# Patient Record
Sex: Female | Born: 1980 | Race: Black or African American | Hispanic: No | Marital: Single | Smoking: Never smoker
Health system: Southern US, Community
[De-identification: ages and names within clinical notes are randomized; demographics above are authoritative.]

## PROBLEM LIST (undated history)

## (undated) DIAGNOSIS — R87629 Unspecified abnormal cytological findings in specimens from vagina: Secondary | ICD-10-CM

## (undated) DIAGNOSIS — F419 Anxiety disorder, unspecified: Secondary | ICD-10-CM

## (undated) HISTORY — DX: Unspecified abnormal cytological findings in specimens from vagina: R87.629

## (undated) HISTORY — PX: COLPOSCOPY: SHX161

---

## 2002-06-07 ENCOUNTER — Inpatient Hospital Stay (HOSPITAL_COMMUNITY): Admission: AD | Admit: 2002-06-07 | Discharge: 2002-06-09 | Payer: Self-pay | Admitting: *Deleted

## 2004-10-14 DIAGNOSIS — R87629 Unspecified abnormal cytological findings in specimens from vagina: Secondary | ICD-10-CM

## 2004-10-14 HISTORY — DX: Unspecified abnormal cytological findings in specimens from vagina: R87.629

## 2008-01-10 ENCOUNTER — Emergency Department (HOSPITAL_COMMUNITY): Admission: EM | Admit: 2008-01-10 | Discharge: 2008-01-10 | Payer: Self-pay | Admitting: Family Medicine

## 2010-12-24 ENCOUNTER — Other Ambulatory Visit: Payer: Self-pay | Admitting: Family Medicine

## 2010-12-24 DIAGNOSIS — N6311 Unspecified lump in the right breast, upper outer quadrant: Secondary | ICD-10-CM

## 2010-12-26 ENCOUNTER — Ambulatory Visit
Admission: RE | Admit: 2010-12-26 | Discharge: 2010-12-26 | Disposition: A | Discharge status: Home / Self Care | Payer: Self-pay | Source: Ambulatory Visit | Attending: Family Medicine | Admitting: Family Medicine

## 2010-12-26 DIAGNOSIS — N6311 Unspecified lump in the right breast, upper outer quadrant: Secondary | ICD-10-CM

## 2011-08-19 ENCOUNTER — Other Ambulatory Visit: Payer: Self-pay | Admitting: Family Medicine

## 2011-08-19 DIAGNOSIS — N6459 Other signs and symptoms in breast: Secondary | ICD-10-CM

## 2011-08-19 DIAGNOSIS — N63 Unspecified lump in unspecified breast: Secondary | ICD-10-CM

## 2011-09-19 ENCOUNTER — Ambulatory Visit
Admission: RE | Admit: 2011-09-19 | Discharge: 2011-09-19 | Disposition: A | Discharge status: Home / Self Care | Payer: Self-pay | Source: Ambulatory Visit | Attending: Family Medicine | Admitting: Family Medicine

## 2011-09-19 ENCOUNTER — Other Ambulatory Visit: Payer: Self-pay | Admitting: Family Medicine

## 2011-09-19 DIAGNOSIS — N63 Unspecified lump in unspecified breast: Secondary | ICD-10-CM

## 2011-09-19 DIAGNOSIS — N6459 Other signs and symptoms in breast: Secondary | ICD-10-CM

## 2011-09-20 ENCOUNTER — Other Ambulatory Visit: Payer: Self-pay | Admitting: Family Medicine

## 2011-09-20 ENCOUNTER — Ambulatory Visit
Admission: RE | Admit: 2011-09-20 | Discharge: 2011-09-20 | Disposition: A | Discharge status: Home / Self Care | Payer: No Typology Code available for payment source | Source: Ambulatory Visit | Attending: Family Medicine | Admitting: Family Medicine

## 2011-09-20 ENCOUNTER — Ambulatory Visit (INDEPENDENT_AMBULATORY_CARE_PROVIDER_SITE_OTHER): Payer: Self-pay | Admitting: *Deleted

## 2011-09-20 VITALS — BP 133/82 | HR 86 | Temp 98.4°F | Ht 59.0 in | Wt 114.6 lb

## 2011-09-20 DIAGNOSIS — N63 Unspecified lump in unspecified breast: Secondary | ICD-10-CM

## 2011-09-20 DIAGNOSIS — N6459 Other signs and symptoms in breast: Secondary | ICD-10-CM

## 2011-09-20 DIAGNOSIS — Z23 Encounter for immunization: Secondary | ICD-10-CM

## 2011-09-20 DIAGNOSIS — N6310 Unspecified lump in the right breast, unspecified quadrant: Secondary | ICD-10-CM

## 2011-09-20 DIAGNOSIS — N6320 Unspecified lump in the left breast, unspecified quadrant: Secondary | ICD-10-CM

## 2011-09-20 MED ORDER — INFLUENZA VIRUS VACC SPLIT PF IM SUSP
0.5000 mL | Freq: Once | INTRAMUSCULAR | Status: AC
  Administered 2011-09-20: 0.5 mL via INTRAMUSCULAR

## 2011-09-20 NOTE — Progress Notes (Signed)
Pt had pap in [facility] in February and was normal

## 2011-09-20 NOTE — Progress Notes (Signed)
Complaints of left and right breast lump.  Pap Smear:    Pap smear not performed today. Patients last Pap smear was February 2012 and was normal per patient. Per patient has a history of an abnormal Pap smear around 3 years ago. Recommended she go to one of the free Pap smear screenings the [facility] offered in February 2013 since has a history of an abnormal Pap smear. No Pap smear results in EPIC.  Physical exam: Breasts Breasts symmetrical. No skin abnormalities bilateral breasts. No nipple retraction bilateral breasts. No nipple discharge bilateral breasts. No lymphadenopathy. Palpated small lump within right breast at 11 o'clock. Patient complained of tenderness when palpated lump right breast. Palpated lump in left breast at 2:30 o'clock around 2.5cm in size. Patient complained of tenderness when palpated lump left breast. Patient scheduled for left breast biopsy today at 1245 to follow up.          Pelvic/Bimanual No Pap smear completed today since last Pap smear was February 2012 and normal per patient. Pap smear not indicated per [facility] guidelines.

## 2011-09-20 NOTE — Patient Instructions (Signed)
Taught patient how to perform BSE and gave educational materials to take home. Patient did not need a Pap smear today due to last Pap smear was in February 2012 per patient. Told patient about free cervical cancer screenings to receive a Pap smear if would like one next year. Patient has a history of an abnormal Pap smear recommended she get a Pap smear next February. Patient is scheduled for a left breast biopsy today, December 7 th at 1245. Patient aware of appointment and will be there. Let patient know will follow up with her within the next couple weeks with results by phone or letter. Patient verbalized understanding.

## 2011-09-23 ENCOUNTER — Other Ambulatory Visit: Payer: Self-pay

## 2011-09-25 ENCOUNTER — Telehealth: Payer: Self-pay | Admitting: *Deleted

## 2011-09-25 NOTE — Telephone Encounter (Signed)
Patient called and left me voicemail with questions in regards to breast biopsy results. Called patient back. Patient has a breast abscess. She picked up medication and did state it was $4.00. Let her know [facility] does not cover the cost of the medication. Told patient she needs to make sure she takes the Doxycycline with food for will upset stomach. Patient thankful I told her that. Patient aware to go back in two weeks to have a follow up breast ultrasound. Patient verbalized understanding.

## 2011-10-14 ENCOUNTER — Other Ambulatory Visit: Payer: Self-pay | Admitting: Family Medicine

## 2011-10-14 ENCOUNTER — Telehealth: Payer: Self-pay | Admitting: *Deleted

## 2011-10-14 DIAGNOSIS — N63 Unspecified lump in unspecified breast: Secondary | ICD-10-CM

## 2011-10-14 NOTE — Telephone Encounter (Signed)
Called patient to follow up on breast ultrasound and needed follow up. No one answered phone. Left voicemail for patient to call me back.

## 2011-10-14 NOTE — Telephone Encounter (Signed)
Patient called me back and left voicemail. Called patient back and reminded patient to that she needed to schedule a follow up breast ultrasound. Patient stated she just scheduled her follow up appointment for this Wednesday, January 2nd at the [facility]. Told patient importance of follow up and that [facility] will cover. Patient verbalized understanding.

## 2011-10-17 ENCOUNTER — Ambulatory Visit
Admission: RE | Admit: 2011-10-17 | Discharge: 2011-10-17 | Disposition: A | Discharge status: Home / Self Care | Payer: No Typology Code available for payment source | Source: Ambulatory Visit | Attending: Family Medicine | Admitting: Family Medicine

## 2011-10-17 ENCOUNTER — Other Ambulatory Visit: Payer: Self-pay | Admitting: Family Medicine

## 2011-10-17 DIAGNOSIS — N63 Unspecified lump in unspecified breast: Secondary | ICD-10-CM

## 2011-10-28 ENCOUNTER — Telehealth: Payer: Self-pay | Admitting: *Deleted

## 2011-10-28 NOTE — Telephone Encounter (Signed)
Called patient to follow up on breast ultrasound. The [facility] gave patient results. Patient stated she was told to come back in 6 months for follow up Breast Ultrasound.

## 2012-03-10 ENCOUNTER — Other Ambulatory Visit: Payer: Self-pay | Admitting: Family Medicine

## 2012-03-10 ENCOUNTER — Other Ambulatory Visit: Payer: Self-pay | Admitting: Obstetrics and Gynecology

## 2012-03-10 DIAGNOSIS — N63 Unspecified lump in unspecified breast: Secondary | ICD-10-CM

## 2012-03-12 ENCOUNTER — Other Ambulatory Visit: Payer: Self-pay | Admitting: Obstetrics and Gynecology

## 2012-03-12 ENCOUNTER — Ambulatory Visit
Admission: RE | Admit: 2012-03-12 | Discharge: 2012-03-12 | Disposition: A | Discharge status: Home / Self Care | Payer: No Typology Code available for payment source | Source: Ambulatory Visit | Attending: Family Medicine | Admitting: Family Medicine

## 2012-03-12 DIAGNOSIS — N63 Unspecified lump in unspecified breast: Secondary | ICD-10-CM

## 2012-03-25 ENCOUNTER — Telehealth: Payer: Self-pay | Admitting: *Deleted

## 2012-03-25 NOTE — Telephone Encounter (Signed)
Telephoned patient and left message to return call to [phone].

## 2012-03-26 ENCOUNTER — Telehealth: Payer: Self-pay | Admitting: *Deleted

## 2012-03-26 NOTE — Telephone Encounter (Signed)
Telephoned patient at home # and left message to return call to [phone]. 

## 2012-03-27 ENCOUNTER — Telehealth: Payer: Self-pay | Admitting: *Deleted

## 2012-03-27 NOTE — Telephone Encounter (Signed)
Patient returned call. Advised patient of results of breast ultrasound and that she needed to make appointment with the breast center of [facility] for follow up in July. Patient voiced understanding.

## 2012-03-27 NOTE — Telephone Encounter (Signed)
Telephoned patient at home # and left message to return call to [phone]. 

## 2012-06-30 ENCOUNTER — Other Ambulatory Visit: Payer: Self-pay | Admitting: Obstetrics and Gynecology

## 2012-06-30 DIAGNOSIS — N6009 Solitary cyst of unspecified breast: Secondary | ICD-10-CM

## 2012-07-01 ENCOUNTER — Ambulatory Visit
Admission: RE | Admit: 2012-07-01 | Discharge: 2012-07-01 | Disposition: A | Discharge status: Home / Self Care | Payer: No Typology Code available for payment source | Source: Ambulatory Visit | Attending: Obstetrics and Gynecology | Admitting: Obstetrics and Gynecology

## 2012-07-01 ENCOUNTER — Other Ambulatory Visit: Payer: Self-pay | Admitting: Obstetrics and Gynecology

## 2012-07-01 DIAGNOSIS — N6009 Solitary cyst of unspecified breast: Secondary | ICD-10-CM

## 2012-09-02 ENCOUNTER — Other Ambulatory Visit: Payer: Self-pay | Admitting: Obstetrics and Gynecology

## 2012-09-02 ENCOUNTER — Ambulatory Visit
Admission: RE | Admit: 2012-09-02 | Discharge: 2012-09-02 | Disposition: A | Discharge status: Home / Self Care | Payer: No Typology Code available for payment source | Source: Ambulatory Visit | Attending: Obstetrics and Gynecology | Admitting: Obstetrics and Gynecology

## 2012-09-02 DIAGNOSIS — N6009 Solitary cyst of unspecified breast: Secondary | ICD-10-CM

## 2012-09-16 ENCOUNTER — Telehealth: Payer: Self-pay | Admitting: Genetic Counselor

## 2012-09-16 NOTE — Telephone Encounter (Signed)
S/W pt in re Genetic appt 02/03 @ 1:30 w/Karen Lowell [employer].  Schedule per Maylon [facility] Welcome packet mailed.

## 2012-11-13 ENCOUNTER — Telehealth: Payer: Self-pay | Admitting: Genetic Counselor

## 2012-11-13 NOTE — Telephone Encounter (Signed)
Asked patient to please bring in copy of her mother's genetic test results for her testing on Monday.

## 2012-11-16 ENCOUNTER — Ambulatory Visit (HOSPITAL_BASED_OUTPATIENT_CLINIC_OR_DEPARTMENT_OTHER): Payer: Self-pay | Admitting: Genetic Counselor

## 2012-11-16 ENCOUNTER — Other Ambulatory Visit: Payer: Self-pay | Admitting: Lab

## 2012-11-16 DIAGNOSIS — IMO0002 Reserved for concepts with insufficient information to code with codable children: Secondary | ICD-10-CM

## 2012-11-16 DIAGNOSIS — Z8481 Family history of carrier of genetic disease: Secondary | ICD-10-CM

## 2012-11-16 DIAGNOSIS — Z803 Family history of malignant neoplasm of breast: Secondary | ICD-10-CM

## 2012-11-17 ENCOUNTER — Encounter: Payer: Self-pay | Admitting: Genetic Counselor

## 2012-11-17 NOTE — Progress Notes (Signed)
Courtney Stokes, a 32 y.o. female, was seen for discussion of the BRCA2 mutation found in her family. She presents to clinic today to discuss the possibility of a genetic predisposition to cancer, and to further clarify her risks, as well as her family members' risks for cancer.   HISTORY OF PRESENT ILLNESS: Courtney Stokes is a 32 y.o. female with no personal history of cancer.    Past Medical History  Diagnosis Date  . Abnormal vaginal Pap smear 2006    History reviewed. No pertinent past surgical history.  History  Substance Use Topics  . Smoking status: Never Smoker   . Smokeless tobacco: Never Used  . Alcohol Use: No    REPRODUCTIVE HISTORY AND PERSONAL RISK ASSESSMENT FACTORS: Menarche was at age 52.   Premenopause Uterus Intact: Yes Ovaries Intact: Yes G1P1A0 , first live birth at age 56  She has not previously undergone treatment for infertility.   OCP use for a few months   She has not used HRT in the past.    FAMILY HISTORY:  We obtained a detailed, 4-generation family history.  Significant diagnoses are listed below: Family History  Problem Relation Age of Onset  . Breast cancer Mother 41  . Breast cancer Maternal Aunt     diagnosed in her 30s and 33s  . Breast cancer Maternal Aunt     diagnosed in her 30s and 24s  . Breast cancer Maternal Aunt 43  . Breast cancer Cousin 21    diagnosed again at 21  . Ovarian cancer Cousin 30  The patient has never been diagnosed with breast cancer.  Her mother was daignosed with breast cancer at age 57 and was found to have a BRCA2 mutation.  Her mother was three sisters and three brothers.  Two sisters had bilateral breast cancer diagnosed in their 30s and again in their 41s, and the third sister had breast cancer diagnosed at age 61.  A brother has a daughter who was diagnosed with breast cancer at ages 68 and 61, and ovarian cancer at age 5.  There is no other cancer history reported.  Patient's maternal ancestors are of  African American descent, and paternal ancestors are of African American descent. There is no reported Ashkenazi Jewish ancestry. There is no  known consanguinity.  GENETIC COUNSELING RISK ASSESSMENT, DISCUSSION, AND SUGGESTED FOLLOW UP: We reviewed the natural history and genetic etiology of sporadic, familial and hereditary cancer syndromes.  We discussed that the patient has a 50% chance of having inherited the BRCA mutation found in her mother.  If she tests positive we could change her medical management based on NCCN guidelines.  If she is negative her risk for breast and ovarian cancer would not be expected to be above the general population risk for these cancers.  The patient's family history of a known BRCA2 mutation is suggestive of the following possible diagnosis: BRCA2 mutation  We discussed that identification of a hereditary cancer syndrome may help her care providers tailor the patients medical management. If a BRCA2 mutation is detected in this case, the [employer] recommendations would include increased cancer surveillance and possible prophylactic surgery. If a mutation is detected, the patient will be referred back to the referring provider and to any additional appropriate care providers to discuss the relevant options.   If a mutation is not found in the patient, cancer surveillance options would be discussed for the patient according to the appropriate standard National Comprehensive  Cancer Network and [employer] guidelines, with consideration of their personal and family history risk factors. In this case, the patient will be referred back to their care providers for discussions of management.   After considering the risks, benefits, and limitations, the patient provided informed consent for  the following  testing: single site testing through [employer].   Per the patient's request, we will contact her by telephone to  discuss these results. A follow up genetic counseling visit will be scheduled if indicated.  The patient was seen for a total of 60 minutes, greater than 50% of which was spent face-to-face counseling.   This note will also be sent to the referring provider via the electronic medical record. The patient will be supplied with a summary of this genetic counseling discussion as well as educational information on the discussed hereditary cancer syndromes following the conclusion of their visit.   Patient was discussed with Dr. Drue Second.   _______________________________________________________________________ For Office Staff:  Number of people involved in session: 3 Was an Intern/ student involved with case: yes

## 2012-11-23 ENCOUNTER — Telehealth: Payer: Self-pay | Admitting: Genetic Counselor

## 2012-11-23 NOTE — Telephone Encounter (Signed)
Left good news message on VM and asked that she call back. 

## 2012-11-26 ENCOUNTER — Encounter: Payer: Self-pay | Admitting: Genetic Counselor

## 2013-08-13 ENCOUNTER — Other Ambulatory Visit (HOSPITAL_COMMUNITY): Payer: Self-pay | Admitting: *Deleted

## 2013-08-13 DIAGNOSIS — Z803 Family history of malignant neoplasm of breast: Secondary | ICD-10-CM

## 2013-08-17 ENCOUNTER — Encounter (INDEPENDENT_AMBULATORY_CARE_PROVIDER_SITE_OTHER): Payer: Self-pay

## 2013-08-17 ENCOUNTER — Encounter (HOSPITAL_COMMUNITY): Payer: Self-pay | Admitting: *Deleted

## 2013-08-17 ENCOUNTER — Encounter (HOSPITAL_COMMUNITY): Payer: Self-pay

## 2013-08-17 ENCOUNTER — Ambulatory Visit (HOSPITAL_COMMUNITY)
Admission: RE | Admit: 2013-08-17 | Discharge: 2013-08-17 | Disposition: A | Discharge status: Home / Self Care | Payer: 59 | Source: Ambulatory Visit | Attending: Obstetrics and Gynecology | Admitting: Obstetrics and Gynecology

## 2013-08-17 VITALS — BP 118/80 | Temp 98.2°F | Ht 59.0 in | Wt 117.2 lb

## 2013-08-17 DIAGNOSIS — Z01419 Encounter for gynecological examination (general) (routine) without abnormal findings: Secondary | ICD-10-CM

## 2013-08-17 DIAGNOSIS — N6320 Unspecified lump in the left breast, unspecified quadrant: Secondary | ICD-10-CM

## 2013-08-17 DIAGNOSIS — N6310 Unspecified lump in the right breast, unspecified quadrant: Secondary | ICD-10-CM

## 2013-08-17 HISTORY — DX: Anxiety disorder, unspecified: F41.9

## 2013-08-17 NOTE — Assessment & Plan Note (Signed)
Referred patient to the [facility] for diagnostic mammogram and possible bilateral breast ultrasounds. Appointment scheduled for Tuesday, September 07, 2013 at 1230 

## 2013-08-17 NOTE — Patient Instructions (Signed)
Taught Courtney Stokes how to perform BSE. Patient did not need a Pap smear today due to last Pap smear was February 2012 per patient. Let her know [facility] will cover Pap smears every 3 years unless has a history of abnormal Pap smears. Referred patient to the [facility] for diagnostic mammogram and possible bilateral breast ultrasounds. Appointment scheduled for Tuesday, September 07, 2013 at 1230. Patient aware of appointment and will be there. Let patient know will follow up with her within the next couple weeks with results for Pap smear by phone. Courtney Stokes verbalized understanding.  Brailon Don, Kathaleen Maser, RN 9:58 AM

## 2013-08-17 NOTE — Progress Notes (Signed)
Patient referred to [facility] due to was recommended 6 month follow up at the [facility] May 2014 and patient did not follow up.   Pap Smear:    Pap smear completed today. Patients last Pap smear was February 2012 and normal per patient. Per patient has a history of an abnormal Pap smear in 2006 that required a colposcopy for follow up. No Pap smear results in EPIC.  Physical exam: Breasts Breasts symmetrical. No skin abnormalities bilateral breasts. No nipple retraction bilateral breasts. No nipple discharge bilateral breasts. No lymphadenopathy. Palpated two lumps within the left breast at 12 o'clock 5 cm from the nipple and a moveable lump at 12 o'clock  2 cm from the nipple. Palpated a lump within the right breast at 12 o'clock under areola. No complaints of pain or tenderness on exam.Referred patient to the [facility] for diagnostic mammogram and possible bilateral breast ultrasounds. Appointment scheduled for Tuesday, September 07, 2013 at 1230      Pelvic/Bimanual   Ext Genitalia No lesions, no swelling and no discharge observed on external genitalia.         Vagina Vagina pink and normal texture. No lesions and small amount of thin white vaginal discharge observed in vagina.          Cervix Cervix is present. Cervix pink and whitish colored bump observed at 12 o'clock. No discharge observed.     Uterus Uterus is present and palpable. Uterus in retroverted and normal size.        Adnexae Bilateral ovaries present and palpable. No tenderness on palpation.          Rectovaginal No rectal exam completed today since patient had no rectal complaints. No skin abnormalities observed on exam.

## 2013-08-17 NOTE — Assessment & Plan Note (Signed)
Referred patient to the [facility] for diagnostic mammogram and possible bilateral breast ultrasounds. Appointment scheduled for Tuesday, September 07, 2013 at 1230

## 2013-08-19 ENCOUNTER — Telehealth (HOSPITAL_COMMUNITY): Payer: Self-pay | Admitting: *Deleted

## 2013-08-19 NOTE — Telephone Encounter (Signed)
Patient brought me two bills that she thought were related to her [facility] visit on 09/20/2011. Did some research and the bills she received were for her flu vaccine received that same day. The bills were not for [facility] services. Called patient and let her know. Patient stated she thought the vaccine was free. Let her know that [facility] doesn't handle vaccines and gave her phone number to the [facility] where vaccine was completed. Patient verbalized understanding.

## 2013-08-20 ENCOUNTER — Telehealth (HOSPITAL_COMMUNITY): Payer: Self-pay | Admitting: *Deleted

## 2013-08-20 NOTE — Telephone Encounter (Signed)
Telephoned patient at home # and discussed negative pap smear results. Next pap smear due in 3 years. Patient voiced understanding.  

## 2013-09-07 ENCOUNTER — Other Ambulatory Visit (HOSPITAL_COMMUNITY): Payer: Self-pay | Admitting: Obstetrics and Gynecology

## 2013-09-07 ENCOUNTER — Ambulatory Visit
Admission: RE | Admit: 2013-09-07 | Discharge: 2013-09-07 | Disposition: A | Discharge status: Home / Self Care | Payer: BC Managed Care – PPO | Source: Ambulatory Visit | Attending: Obstetrics and Gynecology | Admitting: Obstetrics and Gynecology

## 2013-09-07 DIAGNOSIS — Z803 Family history of malignant neoplasm of breast: Secondary | ICD-10-CM

## 2014-02-04 ENCOUNTER — Other Ambulatory Visit: Payer: Self-pay | Admitting: Obstetrics and Gynecology

## 2014-02-04 DIAGNOSIS — N6001 Solitary cyst of right breast: Secondary | ICD-10-CM

## 2014-02-17 ENCOUNTER — Other Ambulatory Visit: Payer: Self-pay | Admitting: Family Medicine

## 2014-02-17 DIAGNOSIS — E0590 Thyrotoxicosis, unspecified without thyrotoxic crisis or storm: Secondary | ICD-10-CM

## 2014-02-21 ENCOUNTER — Ambulatory Visit
Admission: RE | Admit: 2014-02-21 | Discharge: 2014-02-21 | Disposition: A | Discharge status: Home / Self Care | Payer: BC Managed Care – PPO | Source: Ambulatory Visit | Attending: Family Medicine | Admitting: Family Medicine

## 2014-02-21 DIAGNOSIS — E0590 Thyrotoxicosis, unspecified without thyrotoxic crisis or storm: Secondary | ICD-10-CM

## 2014-03-02 ENCOUNTER — Other Ambulatory Visit: Payer: Self-pay | Admitting: Obstetrics and Gynecology

## 2014-03-02 ENCOUNTER — Other Ambulatory Visit: Payer: Self-pay

## 2014-03-02 DIAGNOSIS — N6001 Solitary cyst of right breast: Secondary | ICD-10-CM

## 2014-03-08 ENCOUNTER — Ambulatory Visit
Admission: RE | Admit: 2014-03-08 | Discharge: 2014-03-08 | Disposition: A | Discharge status: Home / Self Care | Payer: BC Managed Care – PPO | Source: Ambulatory Visit | Attending: Obstetrics and Gynecology | Admitting: Obstetrics and Gynecology

## 2014-03-08 ENCOUNTER — Encounter (INDEPENDENT_AMBULATORY_CARE_PROVIDER_SITE_OTHER): Payer: Self-pay

## 2014-03-08 DIAGNOSIS — N6001 Solitary cyst of right breast: Secondary | ICD-10-CM

## 2014-08-15 ENCOUNTER — Encounter (HOSPITAL_COMMUNITY): Payer: Self-pay

## 2015-06-11 IMAGING — US US SOFT TISSUE HEAD/NECK
1 series · 14 of 25 positions shown · non-contrast
Comparison: None.

CLINICAL DATA: Hyperthyroidism

EXAM:
THYROID ULTRASOUND
TECHNIQUE: Ultrasound examination of the thyroid gland and adjacent soft
tissues was performed.

[Series 1: us soft tissue head/neck · 0.10mm/px · 14 of 53 slices shown]
[im 1/53]
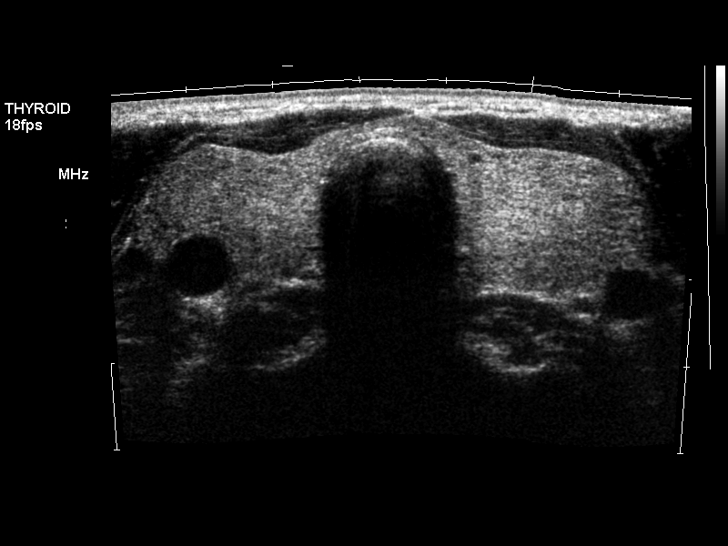
[im 5/53]
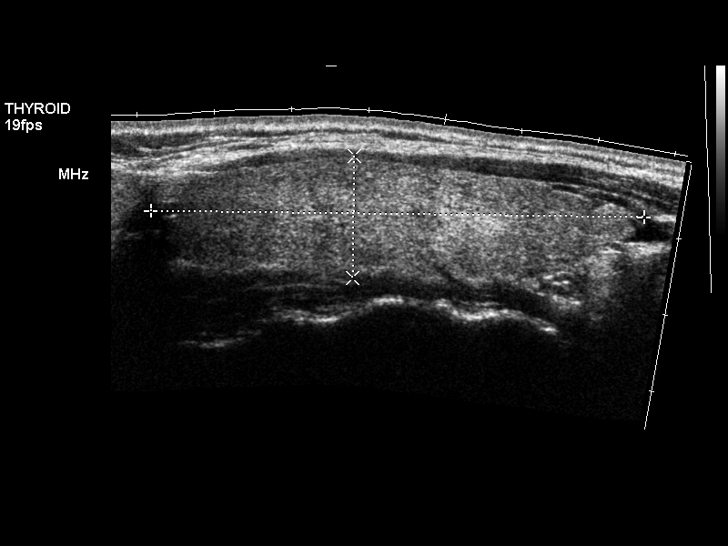
[im 9/53]
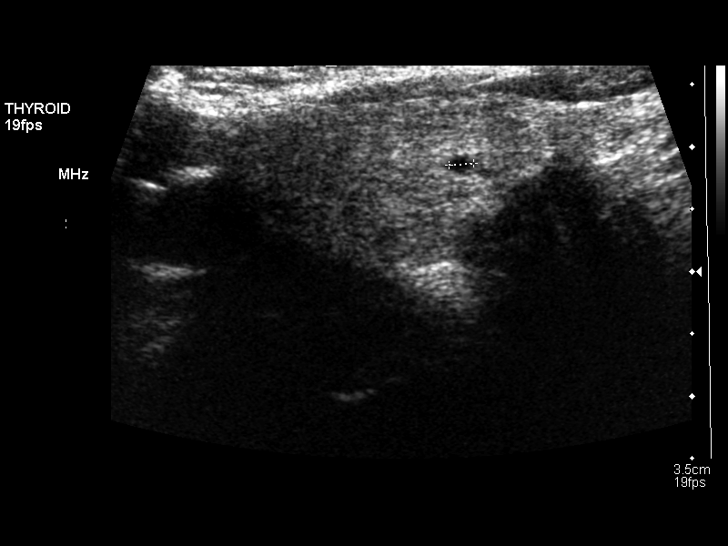
[im 14/53]
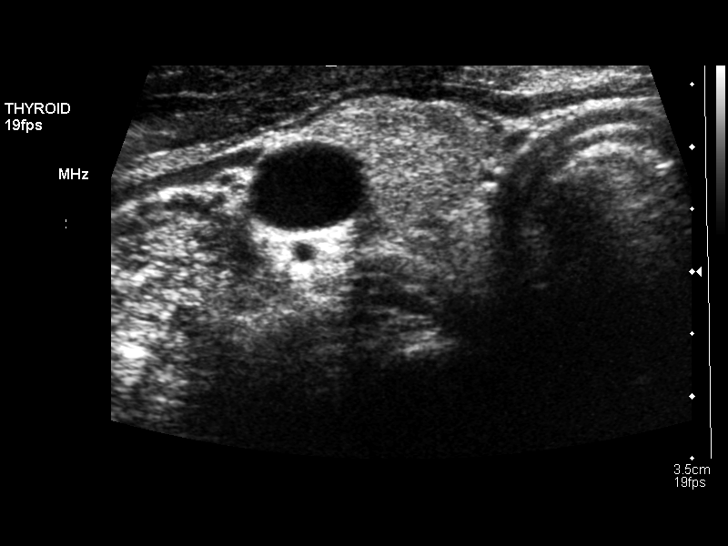
[im 18/53]
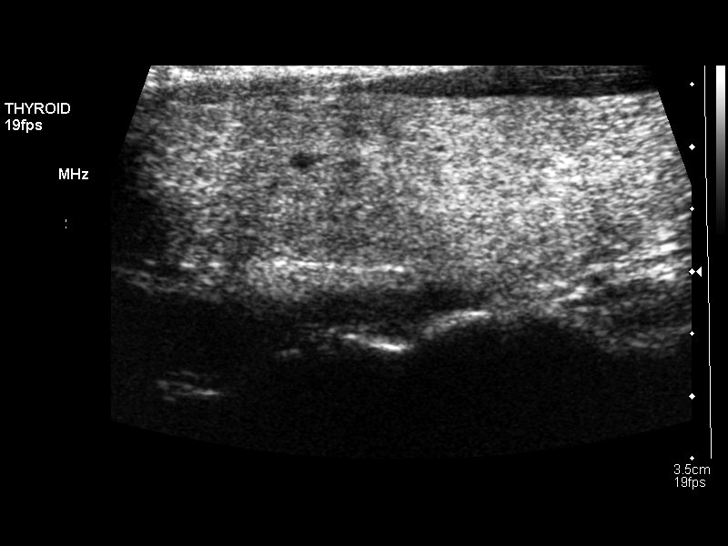
[im 20/53]
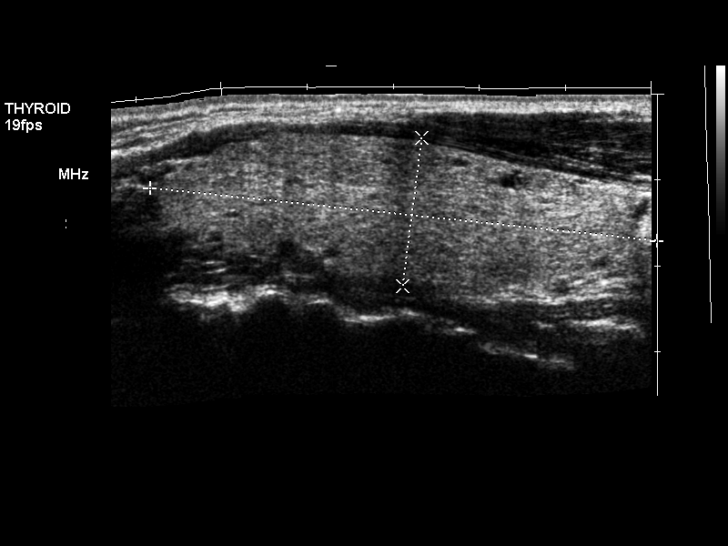
[im 24/53]
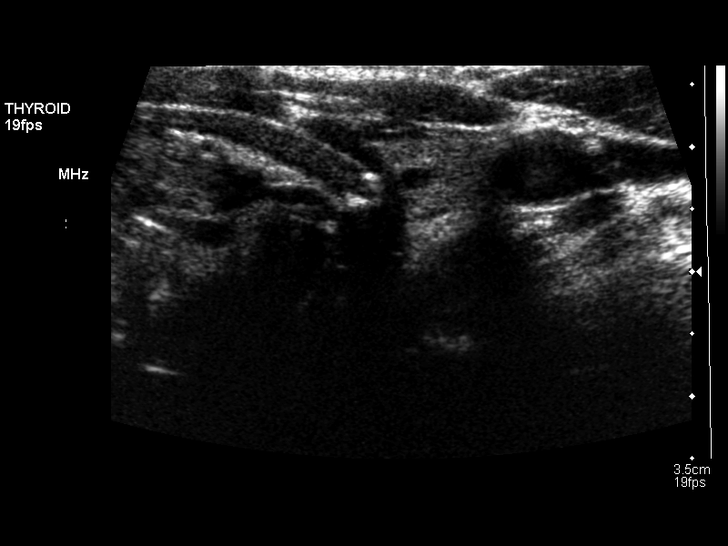
[im 29/53]
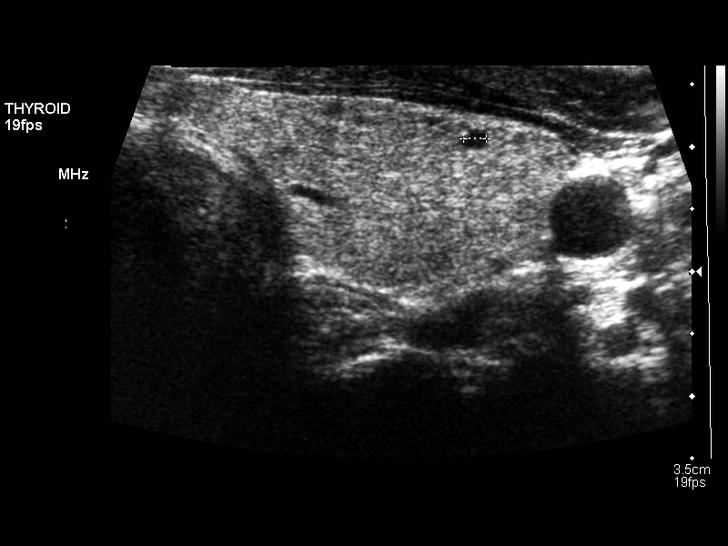
[im 33/53]
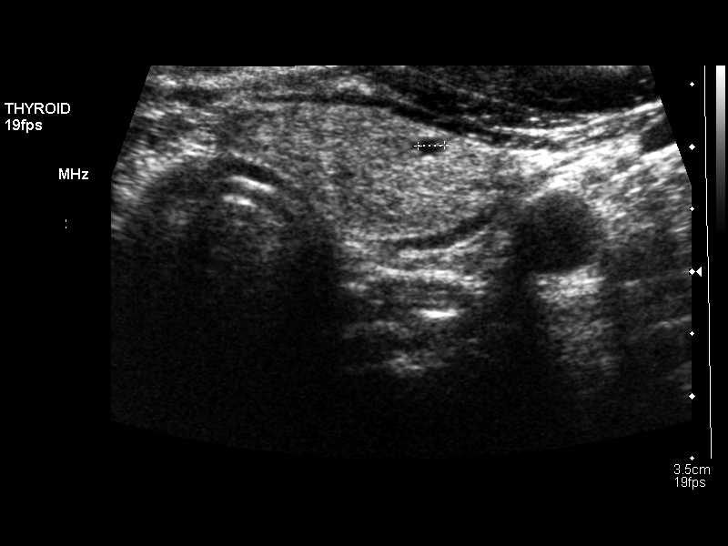
[im 35/53]
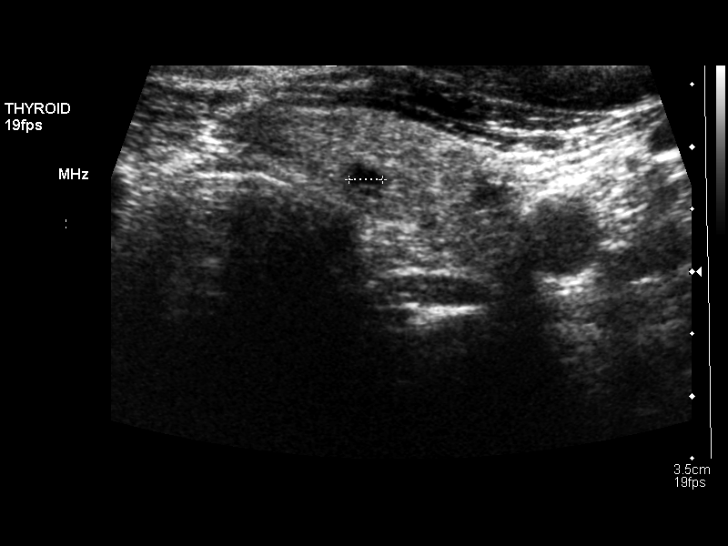
[im 40/53]
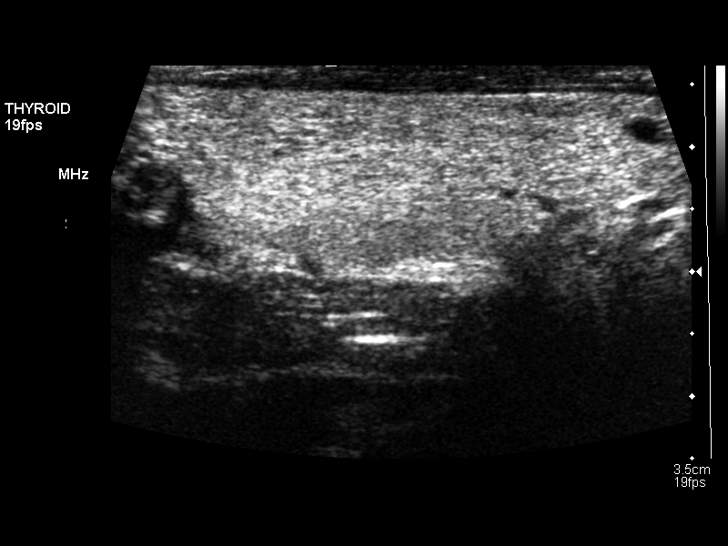
[im 44/53]
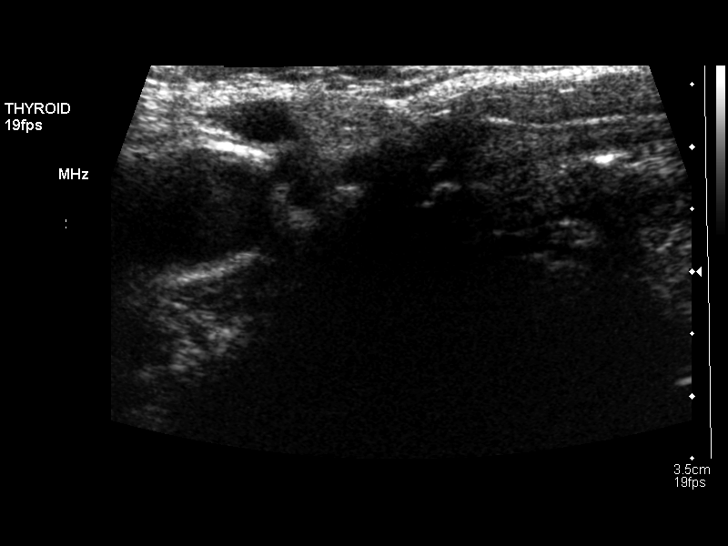
[im 48/53]
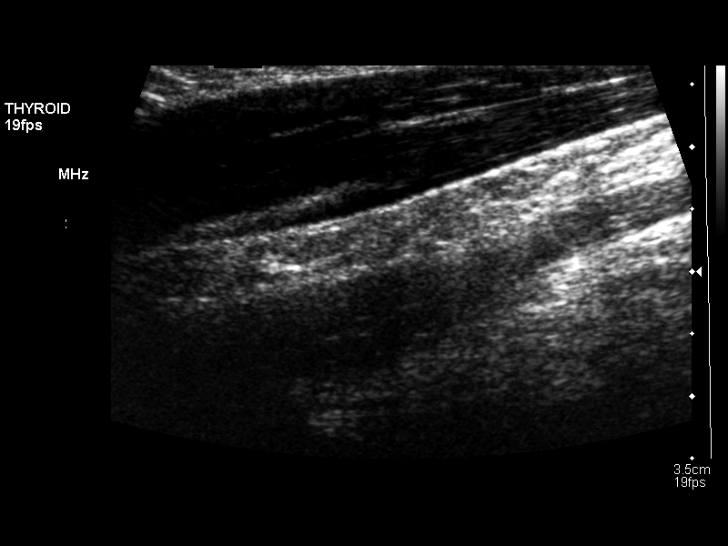
[im 53/53]
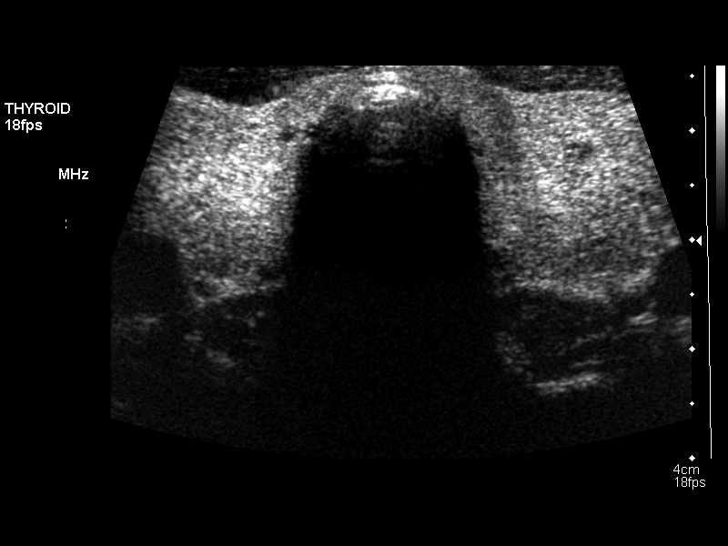

[14 of 25 positions shown; findings below may reference images not displayed]

FINDINGS: Right thyroid lobe

Measurements: 6.4 x 1.6 x 2.5 cm.. Only a tiny hypoechoic nodule of
2 mm is noted in the medial upper pole.

Left thyroid lobe

Measurements: 5.9 x 1.7 x 2.6 cm.. Small hypoechoic nodules are
present, none larger than 5 mm in diameter.

Isthmus

Thickness: 1.9 mm in thickness..  No nodules visualized.

Lymphadenopathy

None visualized.
IMPRESSION: The thyroid gland is minimally prominent with only small hypoechoic
nodules, none larger than 5 mm in diameter.

## 2017-08-18 ENCOUNTER — Encounter (HOSPITAL_COMMUNITY): Payer: Self-pay

## 2017-09-17 ENCOUNTER — Encounter (HOSPITAL_COMMUNITY): Payer: Self-pay
